# Patient Record
Sex: Female | Born: 1950 | Race: White | Hispanic: No | Marital: Married
Health system: Southern US, Community
[De-identification: ages and names within clinical notes are randomized; demographics above are authoritative.]

---

## 2022-03-11 ENCOUNTER — Emergency Department (HOSPITAL_COMMUNITY): Payer: PRIVATE HEALTH INSURANCE

## 2022-03-11 ENCOUNTER — Emergency Department (HOSPITAL_COMMUNITY)
Admission: EM | Admit: 2022-03-11 | Discharge: 2022-03-11 | Disposition: A | Discharge status: Home / Self Care | Payer: PRIVATE HEALTH INSURANCE | Attending: Student | Admitting: Student

## 2022-03-11 ENCOUNTER — Encounter (HOSPITAL_COMMUNITY): Payer: Self-pay

## 2022-03-11 DIAGNOSIS — W01198A Fall on same level from slipping, tripping and stumbling with subsequent striking against other object, initial encounter: Secondary | ICD-10-CM | POA: Insufficient documentation

## 2022-03-11 DIAGNOSIS — Y9301 Activity, walking, marching and hiking: Secondary | ICD-10-CM | POA: Insufficient documentation

## 2022-03-11 DIAGNOSIS — S0181XA Laceration without foreign body of other part of head, initial encounter: Secondary | ICD-10-CM | POA: Diagnosis not present

## 2022-03-11 DIAGNOSIS — S63124A Dislocation of unspecified interphalangeal joint of right thumb, initial encounter: Secondary | ICD-10-CM | POA: Diagnosis not present

## 2022-03-11 DIAGNOSIS — Y92197 Garden or yard of other specified residential institution as the place of occurrence of the external cause: Secondary | ICD-10-CM | POA: Insufficient documentation

## 2022-03-11 DIAGNOSIS — S63104A Unspecified dislocation of right thumb, initial encounter: Secondary | ICD-10-CM

## 2022-03-11 DIAGNOSIS — S6991XA Unspecified injury of right wrist, hand and finger(s), initial encounter: Secondary | ICD-10-CM | POA: Diagnosis present

## 2022-03-11 MED ORDER — CEPHALEXIN 500 MG PO CAPS: 500.0000 mg | ORAL_CAPSULE | Freq: Four times a day (QID) | ORAL | 0 refills | Status: AC

## 2022-03-11 MED ORDER — CEPHALEXIN 500 MG PO CAPS
500.0000 mg | ORAL_CAPSULE | Freq: Once | ORAL | Status: AC
  Administered 2022-03-11: 500 mg via ORAL
  Filled 2022-03-11: qty 1

## 2022-03-11 MED ORDER — LIDOCAINE HCL (PF) 1 % IJ SOLN
15.0000 mL | Freq: Once | INTRAMUSCULAR | Status: AC
Start: 2022-03-11 — End: 2022-03-11
  Administered 2022-03-11: 15 mL via INTRADERMAL
  Filled 2022-03-11: qty 30

## 2022-03-11 MED ORDER — CEPHALEXIN 500 MG PO CAPS: 500.0000 mg | ORAL_CAPSULE | Freq: Four times a day (QID) | ORAL | 0 refills | Status: DC

## 2022-03-11 NOTE — ED Provider Notes (Signed)
?[facility]-EMERGENCY DEPT ?Provider Note ? ?CSN: [id number] ?Arrival date & time: 03/11/22 1106 ? ?Chief Complaint(s) ?Fall ? ?HPI ?Deanna Heath is a 71 y.o. female who presents emergency department for evaluation of a fall, hand injury and chin injury.  Patient states that she was walking in the garden, tripped and landed on a concrete floor.  She struck her chin on the ground and landed on outstretched right hand.  Patient arrives with an open thumb dislocation and a laceration under the chin.  Of note, patient arrives with family who states that the patient is returning to [place] tomorrow and it would like minimal ER interventions today as her insurance will cover things in [place].  They are requesting stabilization therapy only and they will complete the work-up in [place].  Denies loss of consciousness, nausea, vomiting, confusion after the fall.  No blood thinner use.  Denies chest pain, shortness of breath, abdominal pain, fever or other systemic symptoms. ? ? ?Fall ? ? ?Past Medical History ?History reviewed. No pertinent past medical history. ?There are no problems to display for this patient. ? ?Home Medication(s) ?Prior to Admission medications   ?Medication Sig Start Date End Date Taking? Authorizing Provider  ?cephALEXin (KEFLEX) 500 MG capsule Take 1 capsule (500 mg total) by mouth 4 (four) times daily for 7 days. 03/11/22 03/18/22  Georgina Krist, Wyn Forster, MD  ?                                                                                                                                  ?Past Surgical History ?History reviewed. No pertinent surgical history. ?Family History ?History reviewed. No pertinent family history. ? ?Social History ?  ?Allergies ?Patient has no allergy information on record. ? ?Review of Systems ?Review of Systems  ?Skin:  Positive for wound.  ? ?Physical Exam ?Vital Signs  ?I have reviewed the triage vital signs ?BP (!) 166/90   Pulse 63   Temp  98.9 ?F (37.2 ?C) (Oral)   Resp 17   LMP  (LMP Unknown)   SpO2 96%  ? ?Physical Exam ?Vitals and nursing note reviewed.  ?Constitutional:   ?   General: She is not in acute distress. ?   Appearance: She is well-developed.  ?HENT:  ?   Head: Normocephalic.  ?   Comments: 3 cm laceration under the chin ?Eyes:  ?   Conjunctiva/sclera: Conjunctivae normal.  ?Cardiovascular:  ?   Rate and Rhythm: Normal rate and regular rhythm.  ?   Heart sounds: No murmur heard. ?Pulmonary:  ?   Effort: Pulmonary effort is normal. No respiratory distress.  ?   Breath sounds: Normal breath sounds.  ?Abdominal:  ?   Palpations: Abdomen is soft.  ?   Tenderness: There is no abdominal tenderness.  ?Musculoskeletal:     ?   General: Swelling and deformity present.  ?   Cervical back: Neck supple.  ?  Comments: Open right thumb dislocation at the level of the interphalangeal joint  ?Skin: ?   General: Skin is warm and dry.  ?   Capillary Refill: Capillary refill takes less than 2 seconds.  ?Neurological:  ?   Mental Status: She is alert.  ?Psychiatric:     ?   Mood and Affect: Mood normal.  ? ? ?ED Results and Treatments ?Labs ?(all labs ordered are listed, but only abnormal results are displayed) ?Labs Reviewed - No data to display                                                                                                                       ? ?Radiology ?DG Hand Complete Right ? ?Result Date: 03/11/2022 ?CLINICAL DATA:  Trauma EXAM: RIGHT HAND - COMPLETE 3+ VIEW COMPARISON:  None. FINDINGS: There is dislocation of interphalangeal joint of right thumb. There is dorsal and palmar displacement of distal phalanx. Small smooth marginated calcifications are noted adjacent to the base of distal phalanx of right thumb. Degenerative changes are noted in the interphalangeal joints, more so in the distal interphalangeal joints. Degenerative changes are noted in the intercarpal and first carpometacarpal joints in the lateral aspect of the  right wrist. Degenerative changes are noted in the metacarpophalangeal joints, more so in the thumb. IMPRESSION: There is dislocation of interphalangeal joint of right thumb. Small smooth marginated calcifications adjacent to the base of distal phalanx of right thumb may suggest recent or old avulsions. Degenerative changes are noted in multiple joints as described in the body of the report. Electronically Signed   By: Ernie AvenaPalani  Rathinasamy M.D.   On: 03/11/2022 12:35  ? ?DG Finger Thumb Right ? ?Result Date: 03/11/2022 ?CLINICAL DATA:  Relocation EXAM: RIGHT THUMB 2+V COMPARISON:  Right hand radiographs 03/11/2022 earlier same day FINDINGS: Interval relocation of the thumb interphalangeal joint. There is a 2 mm focus of mineralization just dorsal to the distal aspect of the thumb proximal phalanx, possibly a tiny fracture component. Additional punctate bone density just dorsal to the base of the thumb distal phalanx, likely a tiny fracture fragment. Mild thumb metacarpophalangeal and interphalangeal joint space narrowing and peripheral spurring. Severe triscaphe and moderate thumb carpometacarpal joint space narrowing and peripheral osteophytosis. IMPRESSION: Interval relocation of the thumb interphalangeal joint. Electronically Signed   By: Neita Garnetonald  Viola M.D.   On: 03/11/2022 14:40   ? ?Pertinent labs & imaging results that were available during my care of the patient were reviewed by me and considered in my medical decision making (see MDM for details). ? ?Medications Ordered in ED ?Medications  ?lidocaine (PF) (XYLOCAINE) 1 % injection 15 mL (15 mLs Intradermal Given by Other 03/11/22 1250)  ?cephALEXin (KEFLEX) capsule 500 mg (500 mg Oral Given 03/11/22 1457)  ?                                                               ?                                                                    ?  Procedures ?Marland KitchenOrtho Injury Treatment ? ?Date/Time: 03/11/2022 4:23 PM ?Performed by: Glendora Score, MD ?Authorized by: Glendora Score, MD  ? ?Consent:  ?  Consent obtained:  Verbal ?  Consent given by:  Patient (son) ?  Risks discussed:  Fracture, nerve damage, restricted joint movement, vascular damage, irreducible dislocation, recurrent dislocation and stiffness ?  Alternatives discussed:  Alternative treatment, immobilization and delayed treatmentInjury location: finger ?Location details: right thumb ?Injury type: dislocation ?Dislocation type: IP ?Pre-procedure neurovascular assessment: neurovascularly intact ?Pre-procedure distal perfusion: normal ?Pre-procedure neurological function: normal ?Pre-procedure range of motion: reduced ?Anesthesia: digital block ? ?Anesthesia: ?Local anesthesia used: yes ?Local Anesthetic: lidocaine 1% without epinephrine ?Anesthetic total: 5 mL ? ?Patient sedated: NoManipulation performed: yes ?Reduction successful: yes ?X-ray confirmed reduction: yes ?Immobilization: splint ?Splint type: thumb spica ?Splint Applied by: Milon Dikes ?Post-procedure neurovascular assessment: post-procedure neurovascularly intact ?Post-procedure distal perfusion: normal ?Post-procedure neurological function: normal ?Post-procedure range of motion: improved ? ? ?Marland Kitchen.Laceration Repair ? ?Date/Time: 03/11/2022 4:26 PM ?Performed by: Glendora Score, MD ?Authorized by: Glendora Score, MD  ? ?Laceration details:  ?  Location:  Face ?  Face location:  Chin ?  Length (cm):  3 ?Pre-procedure details:  ?  Preparation:  Patient was prepped and draped in usual sterile fashion ?Exploration:  ?  Contaminated: no   ?Treatment:  ?  Area cleansed with:  Saline ?  Amount of cleaning:  Standard ?  Irrigation method:  Pressure wash ?Skin repair:  ?  Repair method:  Sutures ?  Suture size:  4-0 ?  Suture material:  Chromic gut ?  Number of sutures:  4 ?Approximation:  ?  Approximation:  Close ?Repair type:  ?  Repair type:  Simple ?Post-procedure details:  ?  Dressing:  Adhesive bandage ?  Procedure completion:  Tolerated well, no immediate  complications ? ?(including critical care time) ? ?Medical Decision Making / ED Course ? ? ?This patient presents to the ED for concern of thumb injury and chin laceration, this involves an extensive number of treatm

## 2022-03-11 NOTE — Discharge Instructions (Addendum)
You were seen in the emergency department for evaluation of a fall.  You requested to forego CT imaging of the head and face and instead opt for treatment in [place] which is not unreasonable.  Your chin laceration was repaired and that your thumb dislocation was reset here in the emergency department.  Please give this paperwork to the providers at the hospital in [place]: ? ?This patient was seen in the emergency department for a fall onto outstretched hand.  She arrived with an open dislocation at the PIP joint of the right dominant thumb.  She requested stabilization here and surgical evaluation in [place] due to insurance issues.  I copiously irrigated the open dislocation and reduced it at bedside.  At the guidance of our hand surgeons, they are requesting to leave this wound open and start the patient on cephalexin with prompt hand surgery follow-up.  Thus, the injury you see occurred on 03/11/2022 and x-rays confirm appropriate relocation.  This patient will need to discuss her care with a hand surgeon if possible.  She was given guidance on high risk for infection and will be important that she completes all of her antibiotics. ?

## 2022-03-11 NOTE — Progress Notes (Signed)
Orthopedic Tech Progress Note ?Patient Details:  ?Deanna Heath ?09-08-1951 ?[id number] ? ?Ortho Devices ?Type of Ortho Device: Thumb velcro splint ?Ortho Device/Splint Interventions: Application ?  ?Post Interventions ?Patient Tolerated: Well ?Instructions Provided: Care of device ? ?Saul Fordyce ?03/11/2022, 2:13 PM ? ?

## 2022-03-11 NOTE — ED Triage Notes (Signed)
Pt presents with c/o fall that occurred today. Pt has a lac on her chin and a right thumb injury, exposed bone on her thumb. ?

## 2023-03-25 IMAGING — DX DG FINGER THUMB 2+V*R*
3 series · 3 of 3 positions shown · non-contrast
Comparison: Right hand radiographs 03/11/2022 earlier same day

CLINICAL DATA: Relocation

EXAM:
RIGHT THUMB 2+V

[finger obl]
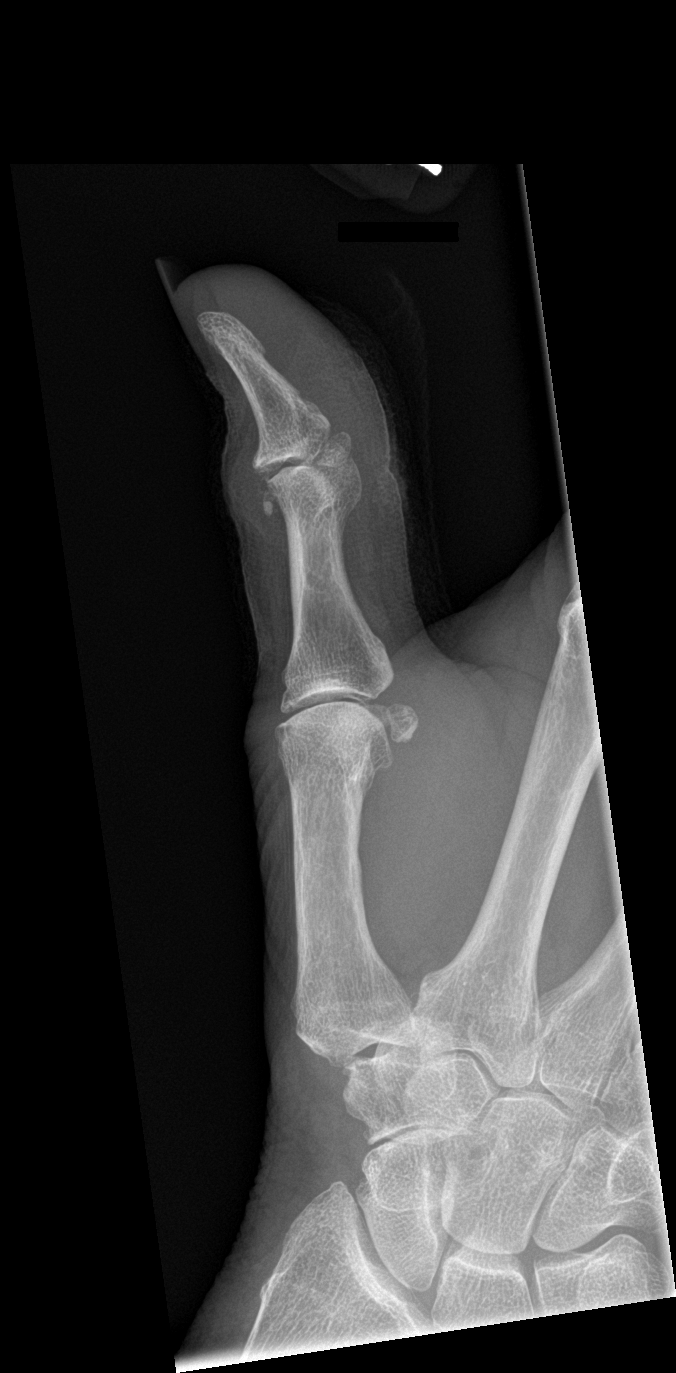

[finger ap]
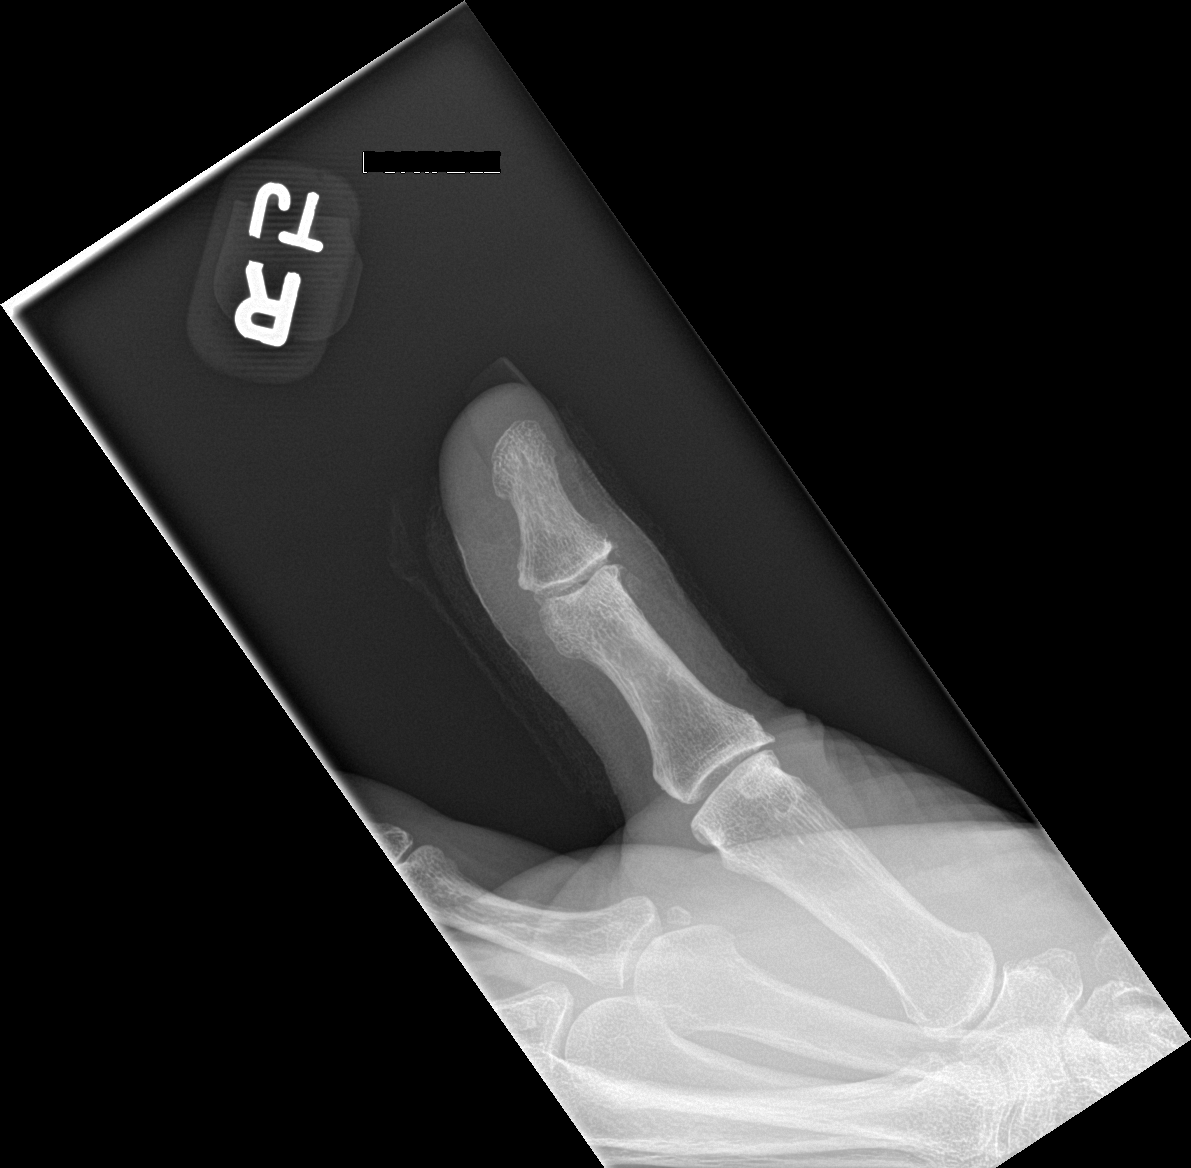

[finger lat]
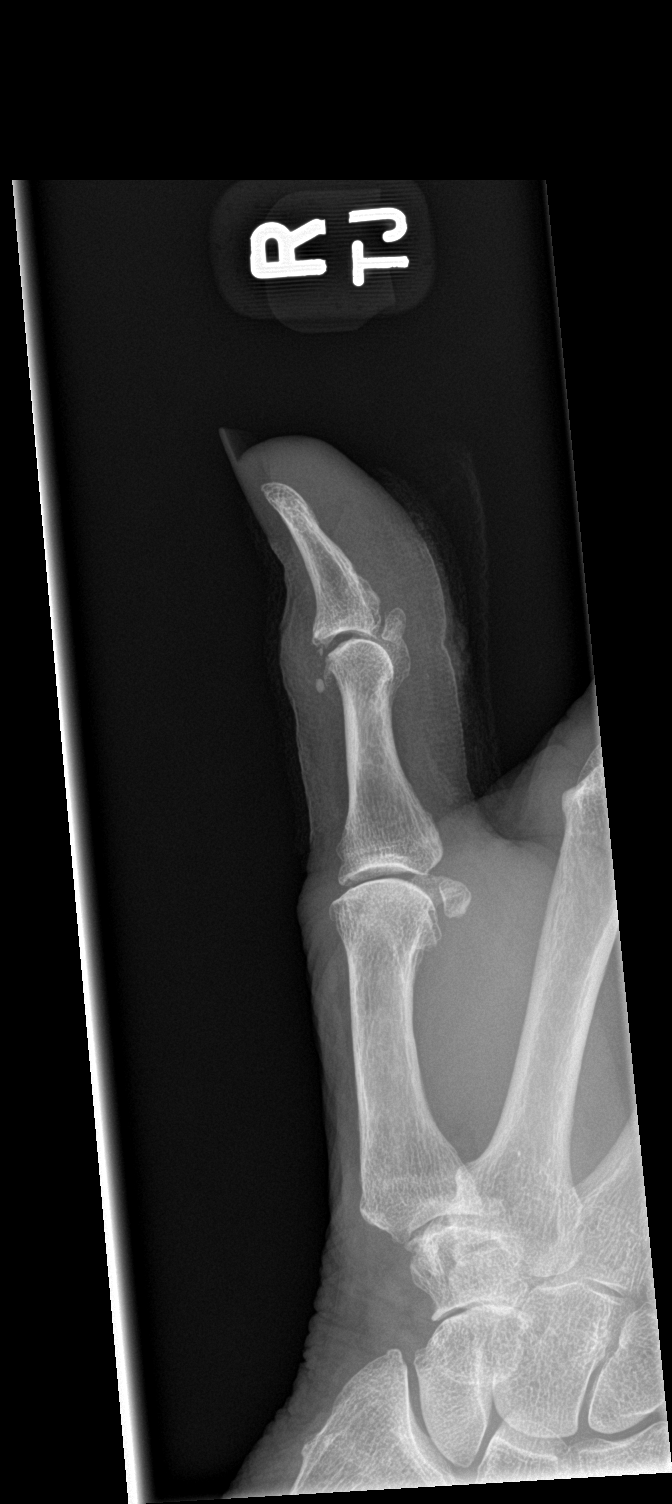

[3 of 3 positions shown; findings below may reference images not displayed]

FINDINGS: Interval relocation of the thumb interphalangeal joint. There is a 2
mm focus of mineralization just dorsal to the distal aspect of the
thumb proximal phalanx, possibly a tiny fracture component.
Additional punctate bone density just dorsal to the base of the
thumb distal phalanx, likely a tiny fracture fragment.

Mild thumb metacarpophalangeal and interphalangeal joint space
narrowing and peripheral spurring.

Severe triscaphe and moderate thumb carpometacarpal joint space
narrowing and peripheral osteophytosis.
IMPRESSION: Interval relocation of the thumb interphalangeal joint.
